# Patient Record
Sex: Female | Born: 1970 | Race: White | Hispanic: No | Marital: Married | Smoking: Never smoker
Health system: Southern US, Community
[De-identification: ages and names within clinical notes are randomized; demographics above are authoritative.]

## PROBLEM LIST (undated history)

## (undated) DIAGNOSIS — R011 Cardiac murmur, unspecified: Secondary | ICD-10-CM

## (undated) DIAGNOSIS — T7840XA Allergy, unspecified, initial encounter: Secondary | ICD-10-CM

## (undated) HISTORY — PX: BREAST ENHANCEMENT SURGERY: SHX7

## (undated) HISTORY — PX: COSMETIC SURGERY: SHX468

## (undated) HISTORY — DX: Allergy, unspecified, initial encounter: T78.40XA

## (undated) HISTORY — PX: TOOTH EXTRACTION: SUR596

---

## 2004-09-29 ENCOUNTER — Inpatient Hospital Stay (HOSPITAL_COMMUNITY): Admission: AD | Admit: 2004-09-29 | Discharge: 2004-10-01 | Payer: Self-pay | Admitting: *Deleted

## 2007-05-22 ENCOUNTER — Encounter: Admission: RE | Admit: 2007-05-22 | Discharge: 2007-05-22 | Payer: Self-pay | Admitting: *Deleted

## 2011-10-03 ENCOUNTER — Ambulatory Visit (INDEPENDENT_AMBULATORY_CARE_PROVIDER_SITE_OTHER): Payer: Managed Care, Other (non HMO)

## 2011-10-03 DIAGNOSIS — J029 Acute pharyngitis, unspecified: Secondary | ICD-10-CM

## 2011-10-03 DIAGNOSIS — B9789 Other viral agents as the cause of diseases classified elsewhere: Secondary | ICD-10-CM

## 2014-03-30 ENCOUNTER — Ambulatory Visit (INDEPENDENT_AMBULATORY_CARE_PROVIDER_SITE_OTHER): Payer: Managed Care, Other (non HMO) | Admitting: Family Medicine

## 2014-03-30 VITALS — BP 118/76 | HR 68 | Temp 98.3°F | Resp 16 | Ht 64.5 in | Wt 158.6 lb

## 2014-03-30 DIAGNOSIS — R5381 Other malaise: Secondary | ICD-10-CM

## 2014-03-30 DIAGNOSIS — R5383 Other fatigue: Secondary | ICD-10-CM

## 2014-03-30 DIAGNOSIS — R112 Nausea with vomiting, unspecified: Secondary | ICD-10-CM

## 2014-03-30 DIAGNOSIS — R51 Headache: Secondary | ICD-10-CM

## 2014-03-30 DIAGNOSIS — B349 Viral infection, unspecified: Secondary | ICD-10-CM

## 2014-03-30 DIAGNOSIS — B9789 Other viral agents as the cause of diseases classified elsewhere: Secondary | ICD-10-CM

## 2014-03-30 LAB — COMPREHENSIVE METABOLIC PANEL
ALBUMIN: 4.8 g/dL (ref 3.5–5.2)
ALT: 10 U/L (ref 0–35)
AST: 17 U/L (ref 0–37)
Alkaline Phosphatase: 45 U/L (ref 39–117)
BUN: 22 mg/dL (ref 6–23)
CHLORIDE: 106 meq/L (ref 96–112)
CO2: 25 meq/L (ref 19–32)
Calcium: 9.1 mg/dL (ref 8.4–10.5)
Creat: 0.83 mg/dL (ref 0.50–1.10)
GLUCOSE: 101 mg/dL — AB (ref 70–99)
POTASSIUM: 4.8 meq/L (ref 3.5–5.3)
SODIUM: 138 meq/L (ref 135–145)
TOTAL PROTEIN: 7.3 g/dL (ref 6.0–8.3)
Total Bilirubin: 0.5 mg/dL (ref 0.2–1.2)

## 2014-03-30 LAB — POCT UA - MICROSCOPIC ONLY
BACTERIA, U MICROSCOPIC: NEGATIVE
CRYSTALS, UR, HPF, POC: NEGATIVE
Casts, Ur, LPF, POC: NEGATIVE
Mucus, UA: NEGATIVE
WBC, UR, HPF, POC: NEGATIVE
Yeast, UA: NEGATIVE

## 2014-03-30 LAB — POCT URINALYSIS DIPSTICK
BILIRUBIN UA: NEGATIVE
GLUCOSE UA: NEGATIVE
KETONES UA: NEGATIVE
Leukocytes, UA: NEGATIVE
Nitrite, UA: NEGATIVE
Protein, UA: NEGATIVE
SPEC GRAV UA: 1.02
Urobilinogen, UA: 0.2
pH, UA: 6

## 2014-03-30 LAB — POCT CBC
GRANULOCYTE PERCENT: 51 % (ref 37–80)
HCT, POC: 38.9 % (ref 37.7–47.9)
Hemoglobin: 12.7 g/dL (ref 12.2–16.2)
Lymph, poc: 2.4 (ref 0.6–3.4)
MCH, POC: 29.5 pg (ref 27–31.2)
MCHC: 32.6 g/dL (ref 31.8–35.4)
MCV: 90.2 fL (ref 80–97)
MID (CBC): 0.4 (ref 0–0.9)
MPV: 7.3 fL (ref 0–99.8)
PLATELET COUNT, POC: 360 10*3/uL (ref 142–424)
POC Granulocyte: 2.9 (ref 2–6.9)
POC LYMPH PERCENT: 42.7 %L (ref 10–50)
POC MID %: 6.3 %M (ref 0–12)
RBC: 4.31 M/uL (ref 4.04–5.48)
RDW, POC: 13.3 %
WBC: 5.7 10*3/uL (ref 4.6–10.2)

## 2014-03-30 MED ORDER — ONDANSETRON 4 MG PO TBDP: ORAL_TABLET | ORAL | Status: DC

## 2014-03-30 NOTE — Patient Instructions (Signed)
Drink plenty of fluids  Rest in a cool environment the next couple of days  Call or return if worse

## 2014-03-30 NOTE — Progress Notes (Signed)
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 Has a lot of stress in her life. She has felt bad the last few days. She has had a headache much of the time. She feels very fatigued. She doesn't rest well. She has worked in the [employer] [place], with only a little fan in her truck. This morning she vomited. She didn't go to work, walked out to her vehicle to check things out, cut feelings worse and came to the office.  Objective: Healthy-appearing young lady in no acute distress. TMs normal. Eyes PERRLA. Throat clear. Neck supple without nodes. Chest clear. Heart regular without murmurs. And soft without mass or tenderness.  Assessment: Fatigue and generalized malaise, etiology unknown Headache Vomiting  Results for orders placed in visit on 03/30/14  POCT CBC      Result Value Ref Range   WBC 5.7  4.6 - 10.2 K/uL   Lymph, poc 2.4  0.6 - 3.4   POC LYMPH PERCENT 42.7  10 - 50 %L   MID (cbc) 0.4  0 - 0.9   POC MID % 6.3  0 - 12 %M   POC Granulocyte 2.9  2 - 6.9   Granulocyte percent 51.0  37 - 80 %G   RBC 4.31  4.04 - 5.48 M/uL   Hemoglobin 12.7  12.2 - 16.2 g/dL   HCT, POC 13.038.9  86.537.7 - 47.9 %   MCV 90.2  80 - 97 fL   MCH, POC 29.5  27 - 31.2 pg   MCHC 32.6  31.8 - 35.4 g/dL   RDW, POC 78.413.3     Platelet Count, POC 360  142 - 424 K/uL   MPV 7.3  0 - 99.8 fL  POCT UA - MICROSCOPIC ONLY      Result Value Ref Range   WBC, Ur, HPF, POC neg     RBC, urine, microscopic 5-7     Bacteria, U Microscopic neg     Mucus, UA neg     Epithelial cells, urine per micros 0-2     Crystals, Ur, HPF, POC neg     Casts, Ur, LPF, POC neg     Yeast, UA neg    POCT URINALYSIS DIPSTICK      Result Value Ref Range   Color, UA light yellow     Clarity, UA slightly cloudy     Glucose, UA neg     Bilirubin, UA neg     Ketones, UA neg     Spec Grav, UA 1.020     Blood, UA mod     pH, UA 6.0     Protein, UA neg     Urobilinogen, UA 0.2     Nitrite, UA neg     Leukocytes,  UA Negative     Prob viral illness.

## 2015-07-21 IMAGING — CR DG LUMBAR SPINE COMPLETE 4+V
5 series · 5 of 5 positions shown · non-contrast
Comparison: None.

CLINICAL DATA: Left hip pain after fall

LUMBAR SPINE - COMPLETE 4+ VIEW

[AP]
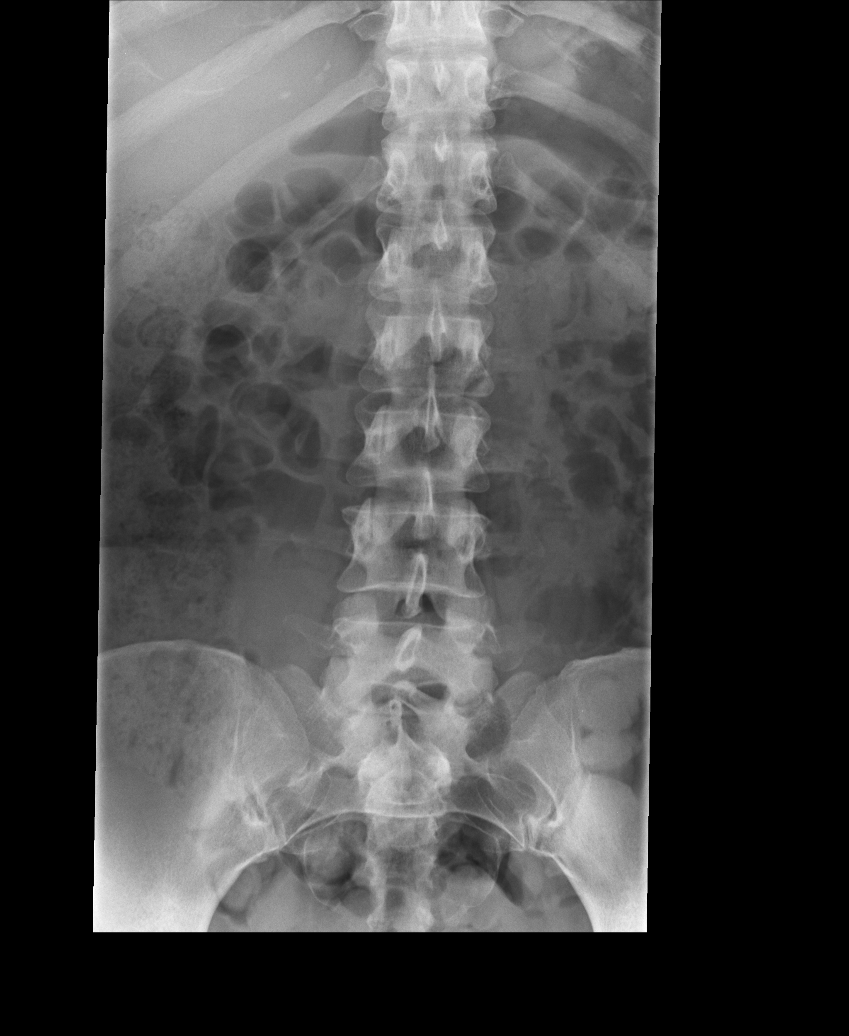

[rpo]
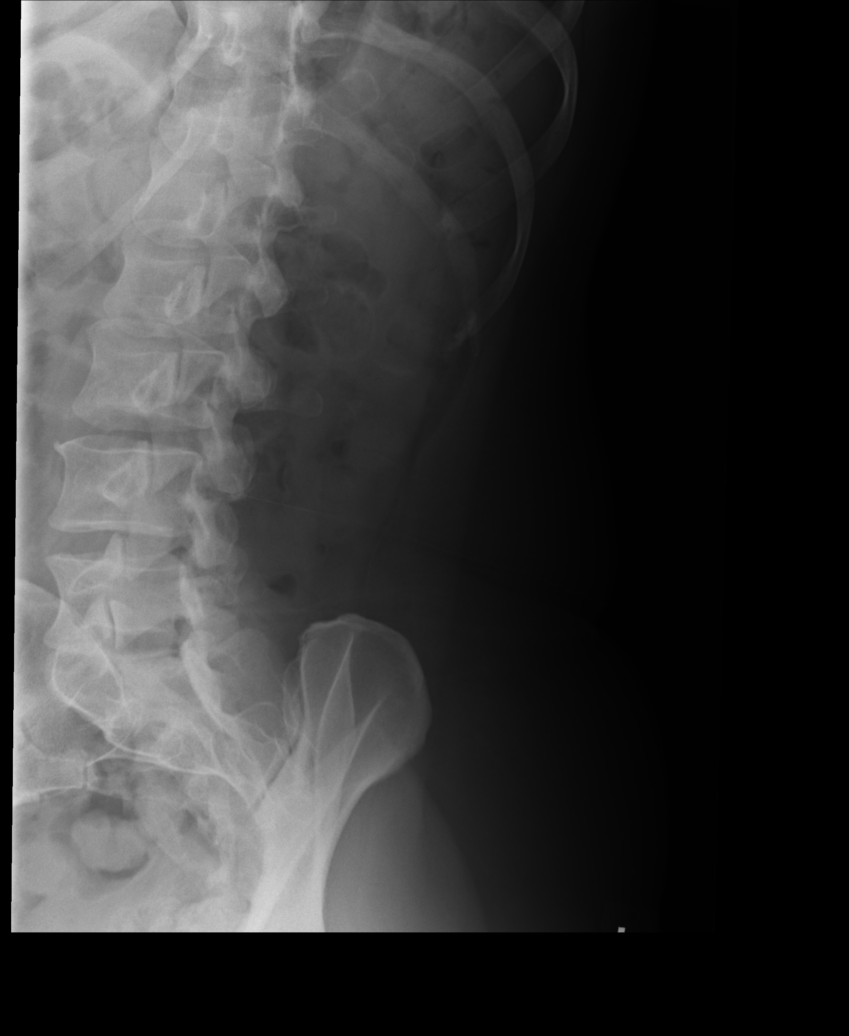

[lpo]
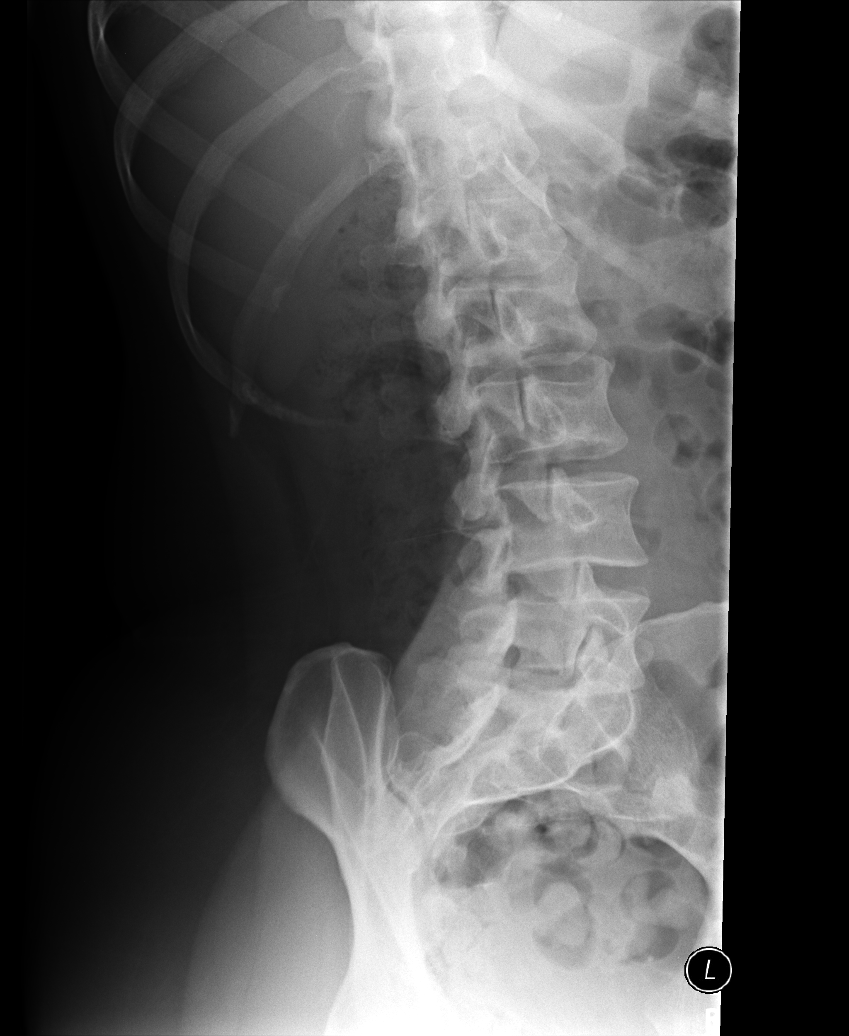

[lateral]
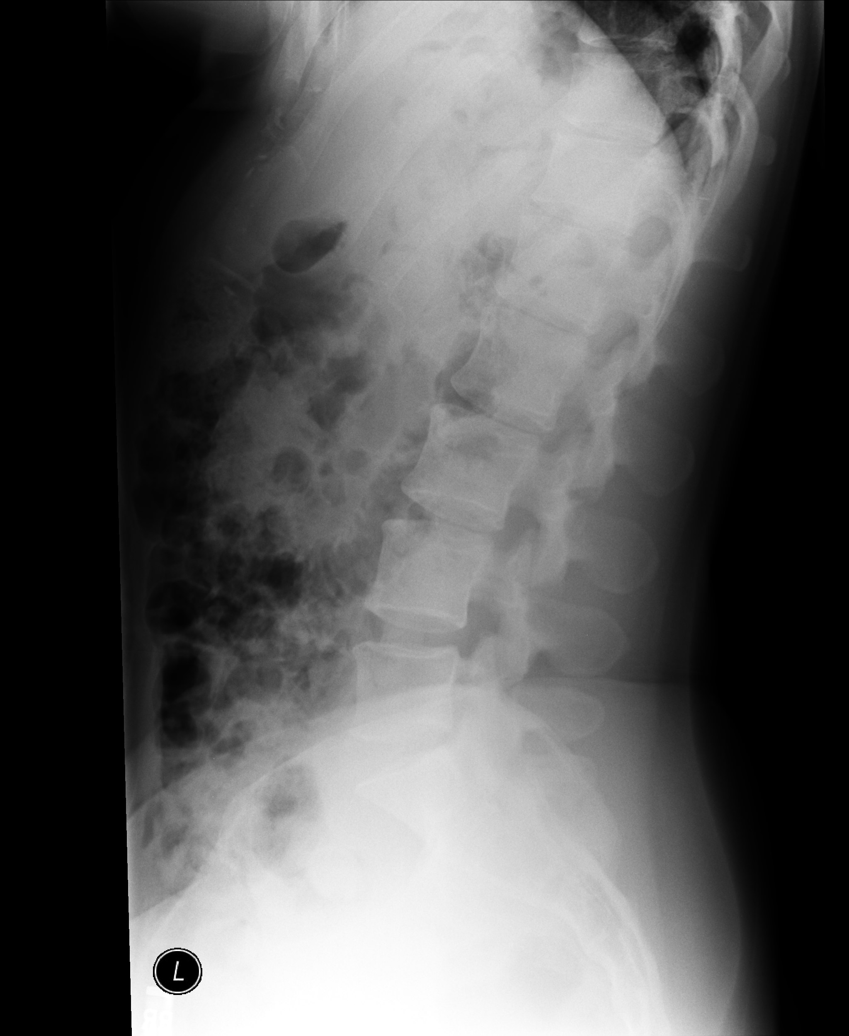

[l5 s1]
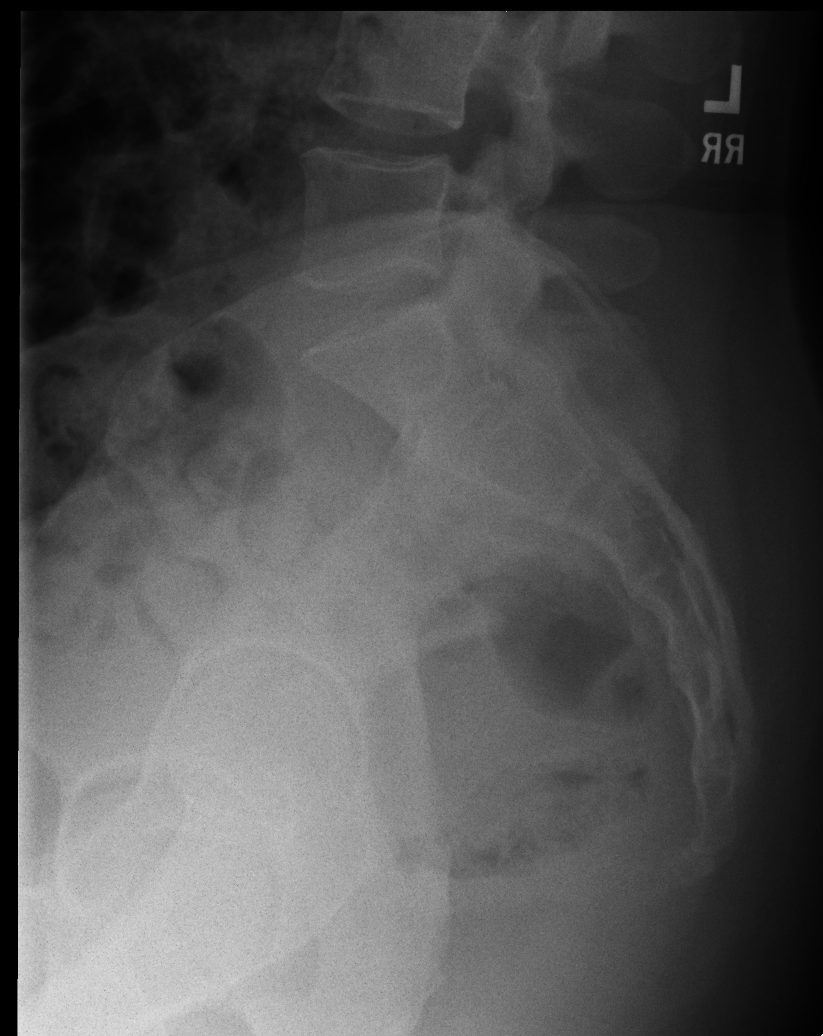

[5 of 5 positions shown; findings below may reference images not displayed]

FINDINGS: No acute fracture or spondylolisthesis is noted.
Bilateral pars defects are noted at L5. Disc spaces are well
maintained.
IMPRESSION: Bilateral pars defects at L5.  No acute abnormality seen in the
lumbar spine.

## 2022-07-22 ENCOUNTER — Ambulatory Visit (INDEPENDENT_AMBULATORY_CARE_PROVIDER_SITE_OTHER): Payer: 59

## 2022-07-22 ENCOUNTER — Ambulatory Visit
Admission: EM | Admit: 2022-07-22 | Discharge: 2022-07-22 | Disposition: A | Discharge status: Home / Self Care | Payer: 59 | Attending: Family Medicine | Admitting: Family Medicine

## 2022-07-22 DIAGNOSIS — R0789 Other chest pain: Secondary | ICD-10-CM | POA: Diagnosis not present

## 2022-07-22 DIAGNOSIS — R079 Chest pain, unspecified: Secondary | ICD-10-CM | POA: Diagnosis not present

## 2022-07-22 HISTORY — DX: Cardiac murmur, unspecified: R01.1

## 2022-07-22 NOTE — ED Triage Notes (Addendum)
Pt presents to Urgent Care with c/o chest pain and L arm pain since this AM. Denies sob. States she had some nausea this morning before eating, but she states is not uncommon for her. Reports syncopal episode approx 2 months ago w/ episode of diarrhea--no f/u exam.

## 2022-07-22 NOTE — Discharge Instructions (Addendum)
Advised patient EKG and CXR were within normal limits and unremarkable today.  Advised if symptoms worsen and/or unresolved please go to nearest ED for further evaluation or follow-up with your PCP.

## 2022-07-22 NOTE — ED Provider Notes (Signed)
[facility]    CSN: [id number] Arrival date & time: 07/22/22  0949      History   Chief Complaint Chief Complaint  Patient presents with   Chest Pain   Arm Pain    HPI Jocelyn Coleman is a 51 y.o. female.   HPI Pleasant 51 year old female presents to urgent care with chest pain and left arm pain since earlier this morning.  Patient reports minimal nausea as well.  Patient reports 1 syncopal episode 2 months ago.  PMH significant for obesity and heart murmur.  Past Medical History:  Diagnosis Date   Heart murmur     There are no problems to display for this patient.   Past Surgical History:  Procedure Laterality Date   TOOTH EXTRACTION      OB History   No obstetric history on file.      Home Medications    Prior to Admission medications   Medication Sig Start Date End Date Taking? Authorizing Provider  ondansetron (ZOFRAN ODT) 4 MG disintegrating tablet Dissolve one in mouth every 6 hours only if needed for nausea 03/30/14   Peyton Najjar, MD    Family History Family History  Problem Relation Age of Onset   Cancer Mother    Hypertension Mother    Hyperlipidemia Father    Hypertension Father     Social History Social History   Tobacco Use   Smoking status: Never   Smokeless tobacco: Never  Vaping Use   Vaping Use: Never used  Substance Use Topics   Alcohol use: Yes    Comment: rarely   Drug use: No     Allergies   Erythromycin   Review of Systems Review of Systems  Cardiovascular:  Positive for chest pain.  All other systems reviewed and are negative.    Physical Exam Triage Vital Signs ED Triage Vitals  Enc Vitals Group     BP 07/22/22 1002 121/82     Pulse Rate 07/22/22 1002 70     Resp 07/22/22 1002 20     Temp 07/22/22 1002 99.1 F (37.3 C)     Temp src --      SpO2 07/22/22 1002 97 %     Weight 07/22/22 1011 176 lb (79.8 kg)     Height 07/22/22 1011 5\' 3"  (1.6 m)     Head Circumference --      Peak Flow  --      Pain Score 07/22/22 1003 4     Pain Loc --      Pain Edu? --      Excl. in GC? --    No data found.  Updated Vital Signs BP 121/82 (BP Location: Right Arm)   Pulse 70   Temp 99.1 F (37.3 C)   Resp 20   Ht 5\' 3"  (1.6 m)   Wt 176 lb (79.8 kg)   LMP 07/19/2022 (Exact Date)   SpO2 97%   BMI 31.18 kg/m    Physical Exam Vitals and nursing note reviewed.  Constitutional:      General: She is not in acute distress.    Appearance: Normal appearance. She is obese. She is not ill-appearing.  HENT:     Head: Normocephalic and atraumatic.     Mouth/Throat:     Mouth: Mucous membranes are moist.     Pharynx: Oropharynx is clear.  Eyes:     Extraocular Movements: Extraocular movements intact.     Conjunctiva/sclera: Conjunctivae normal.  Pupils: Pupils are equal, round, and reactive to light.  Neck:     Comments: No JVD, no bruit Cardiovascular:     Rate and Rhythm: Normal rate and regular rhythm.     Pulses: Normal pulses.     Heart sounds: Normal heart sounds.  Pulmonary:     Effort: Pulmonary effort is normal.     Breath sounds: Normal breath sounds. No wheezing, rhonchi or rales.  Musculoskeletal:        General: Normal range of motion.     Cervical back: Normal range of motion and neck supple. No tenderness.  Lymphadenopathy:     Cervical: No cervical adenopathy.  Skin:    General: Skin is warm and dry.  Neurological:     General: No focal deficit present.     Mental Status: She is alert and oriented to person, place, and time.      UC Treatments / Results  Labs (all labs ordered are listed, but only abnormal results are displayed) Labs Reviewed - No data to display  EKG   Radiology DG Chest 2 View  Result Date: 07/22/2022 CLINICAL DATA:  Chest pain. EXAM: CHEST - 2 VIEW COMPARISON:  None Available. FINDINGS: Low lung volumes. No consolidation. No visible pleural effusions or pneumothorax. Cardiomediastinal silhouette is within normal limits  for technique. No evidence of acute osseous abnormality. IMPRESSION: Low lung volumes without evidence of acute cardiopulmonary disease. Electronically Signed   By: Margaretha Sheffield M.D.   On: 07/22/2022 10:42    Procedures Procedures (including critical care time)  Medications Ordered in UC Medications - No data to display  Initial Impression / Assessment and Plan / UC Course  I have reviewed the triage vital signs and the nursing notes.  Pertinent labs & imaging results that were available during my care of the patient were reviewed by me and considered in my medical decision making (see chart for details).     MDM: 1.  Chest pain unspecified-EKG revealed normal sinus rhythm, CXR revealed above. Advised patient EKG and CXR were within normal limits and unremarkable today.  Advised if symptoms worsen and/or unresolved please go to nearest ED for further evaluation or follow-up with your PCP.  Final Clinical Impressions(s) / UC Diagnoses   Final diagnoses:  Chest pain, unspecified type     Discharge Instructions      Advised patient EKG and CXR were within normal limits and unremarkable today.  Advised if symptoms worsen and/or unresolved please go to nearest ED for further evaluation or follow-up with your PCP.     ED Prescriptions   None    PDMP not reviewed this encounter.   Eliezer Lofts, [place] 07/22/22 1058

## 2022-07-23 ENCOUNTER — Telehealth: Payer: Self-pay

## 2022-07-23 NOTE — Telephone Encounter (Signed)
TCT pt to follow up from recent visit. VM not set up.

## 2023-11-15 ENCOUNTER — Other Ambulatory Visit: Payer: Self-pay

## 2023-11-15 ENCOUNTER — Ambulatory Visit
Admission: EM | Admit: 2023-11-15 | Discharge: 2023-11-15 | Disposition: A | Discharge status: Home / Self Care | Payer: 59 | Attending: Family Medicine | Admitting: Family Medicine

## 2023-11-15 DIAGNOSIS — R059 Cough, unspecified: Secondary | ICD-10-CM

## 2023-11-15 DIAGNOSIS — R509 Fever, unspecified: Secondary | ICD-10-CM

## 2023-11-15 DIAGNOSIS — R6889 Other general symptoms and signs: Secondary | ICD-10-CM

## 2023-11-15 DIAGNOSIS — R110 Nausea: Secondary | ICD-10-CM

## 2023-11-15 LAB — POC SARS CORONAVIRUS 2 AG -  ED: SARS Coronavirus 2 Ag: NEGATIVE

## 2023-11-15 MED ORDER — ONDANSETRON 8 MG PO TBDP
8.0000 mg | ORAL_TABLET | Freq: Once | ORAL | Status: AC
  Administered 2023-11-15: 8 mg via ORAL

## 2023-11-15 MED ORDER — ONDANSETRON 8 MG PO TBDP: 8.0000 mg | ORAL_TABLET | Freq: Three times a day (TID) | ORAL | 0 refills | Status: DC | PRN

## 2023-11-15 MED ORDER — OSELTAMIVIR PHOSPHATE 75 MG PO CAPS: 75.0000 mg | ORAL_CAPSULE | Freq: Two times a day (BID) | ORAL | 0 refills | Status: DC

## 2023-11-15 MED ORDER — PROMETHAZINE-DM 6.25-15 MG/5ML PO SYRP: 5.0000 mL | ORAL_SOLUTION | Freq: Two times a day (BID) | ORAL | 0 refills | Status: DC | PRN

## 2023-11-15 MED ORDER — BENZONATATE 200 MG PO CAPS: 200.0000 mg | ORAL_CAPSULE | Freq: Three times a day (TID) | ORAL | 0 refills | Status: AC | PRN

## 2023-11-15 NOTE — Discharge Instructions (Addendum)
 Advised patient to take medication as directed with food to completion.  Advised may take Zofran  daily, as needed for nausea.  Advised may take Tessalon  capsules daily or as needed for cough.  Advised may take Promethazine  DM at night for cough prior to sleep due to sedative effects.  Advised patient may take OTC Tylenol 1 g every 6 hours for fever (oral temperature greater than 100.3).  Encouraged to increase daily water intake to 64 ounces per day while taking these medications.  Advised if symptoms worsen and/or unresolved please follow-up with PCP or here for further evaluation.

## 2023-11-15 NOTE — ED Provider Notes (Signed)
 [facility]    CSN: [id number] Arrival date & time: 11/15/23  0815      History   Chief Complaint Chief Complaint  Patient presents with   Fever   Nasal Congestion   Sore Throat    HPI Jocelyn Coleman is a 53 y.o. female.   HPI 53 year old female presents with runny nose, fever and sore throat since yesterday.  Fever of 101 this morning.  Patient is accompanied by her husband this morning.  Past Medical History:  Diagnosis Date   Heart murmur     There are no active problems to display for this patient.   Past Surgical History:  Procedure Laterality Date   TOOTH EXTRACTION      OB History   No obstetric history on file.      Home Medications    Prior to Admission medications   Medication Sig Start Date End Date Taking? Authorizing Provider  benzonatate  (TESSALON ) 200 MG capsule Take 1 capsule (200 mg total) by mouth 3 (three) times daily as needed for up to 7 days. 11/15/23 11/22/23 Yes Teddy Sharper, FNP  ondansetron  (ZOFRAN -ODT) 8 MG disintegrating tablet Take 1 tablet (8 mg total) by mouth every 8 (eight) hours as needed for nausea or vomiting. 11/15/23  Yes Teddy Sharper, FNP  oseltamivir  (TAMIFLU ) 75 MG capsule Take 1 capsule (75 mg total) by mouth every 12 (twelve) hours. 11/15/23  Yes Teddy Sharper, FNP  promethazine -dextromethorphan (PROMETHAZINE -DM) 6.25-15 MG/5ML syrup Take 5 mLs by mouth 2 (two) times daily as needed for cough. 11/15/23  Yes Teddy Sharper, FNP    Family History Family History  Problem Relation Age of Onset   Cancer Mother    Hypertension Mother    Hyperlipidemia Father    Hypertension Father     Social History Social History   Tobacco Use   Smoking status: Never   Smokeless tobacco: Never  Vaping Use   Vaping status: Never Used  Substance Use Topics   Alcohol use: Yes    Comment: rarely   Drug use: No     Allergies   Erythromycin   Review of Systems Review of Systems  Constitutional:  Positive for  fever.  HENT:  Positive for congestion, rhinorrhea and sore throat.   Gastrointestinal:  Positive for nausea.  All other systems reviewed and are negative.    Physical Exam Triage Vital Signs ED Triage Vitals  Encounter Vitals Group     BP 11/15/23 0831 101/70     Systolic BP Percentile --      Diastolic BP Percentile --      Pulse Rate 11/15/23 0831 97     Resp 11/15/23 0831 17     Temp 11/15/23 0831 98.6 F (37 C)     Temp Source 11/15/23 0831 Oral     SpO2 11/15/23 0831 97 %     Weight --      Height --      Head Circumference --      Peak Flow --      Pain Score 11/15/23 0832 8     Pain Loc --      Pain Education --      Exclude from Growth Chart --    No data found.  Updated Vital Signs BP 101/70 (BP Location: Left Arm)   Pulse 97   Temp 98.6 F (37 C) (Oral)   Resp 17   LMP  (LMP Unknown)   SpO2 97%  Physical Exam Vitals and nursing note reviewed.  Constitutional:      Appearance: Normal appearance. She is well-developed. She is obese. She is ill-appearing.  HENT:     Head: Normocephalic and atraumatic.     Right Ear: Tympanic membrane, ear canal and external ear normal.     Left Ear: Tympanic membrane, ear canal and external ear normal.     Mouth/Throat:     Mouth: Mucous membranes are moist.     Pharynx: Oropharynx is clear. Uvula midline.  Eyes:     Extraocular Movements: Extraocular movements intact.     Conjunctiva/sclera: Conjunctivae normal.     Pupils: Pupils are equal, round, and reactive to light.  Cardiovascular:     Rate and Rhythm: Normal rate and regular rhythm.     Pulses: Normal pulses.     Heart sounds: Normal heart sounds. No murmur heard.    No friction rub. No gallop.  Pulmonary:     Effort: Pulmonary effort is normal.     Breath sounds: Normal breath sounds. No wheezing, rhonchi or rales.     Comments: Infrequent nonproductive cough on exam Chest:     Chest wall: No tenderness.  Musculoskeletal:        General: Normal  range of motion.     Cervical back: Normal range of motion and neck supple.  Skin:    General: Skin is warm and dry.  Neurological:     General: No focal deficit present.     Mental Status: She is alert and oriented to person, place, and time.  Psychiatric:        Mood and Affect: Mood normal.        Behavior: Behavior normal.      UC Treatments / Results  Labs (all labs ordered are listed, but only abnormal results are displayed) Labs Reviewed  POC SARS CORONAVIRUS 2 AG -  ED    EKG   Radiology No results found.  Procedures Procedures (including critical care time)  Medications Ordered in UC Medications  ondansetron  (ZOFRAN -ODT) disintegrating tablet 8 mg (8 mg Oral Given 11/15/23 0912)    Initial Impression / Assessment and Plan / UC Course  I have reviewed the triage vital signs and the nursing notes.  Pertinent labs & imaging results that were available during my care of the patient were reviewed by me and considered in my medical decision making (see chart for details).     MDM: 1.  Influenza-like symptoms-Rx'd Tamiflu  75 mg capsule: Take 1 capsule twice daily x 5 days; 2.  Nausea-Zofran  8 mg disintegrating tablet given once in clinic and prior to discharge, Rx'd Zofran  8 mg disintegrating tablet: Take 1 tablet every 8 hours, as needed for nausea or vomiting; 3.  Fever-Advised patient may take OTC Tylenol 1 g every 6 hours for fever (oral temperature greater than 100.3). 4.  Cough, unspecified type-Rx'd Tessalon  200 mg capsule: Take 1 capsule 3 times daily, as needed for cough, Rx'd Promethazine  DM 6.25-50 Mg/5 mL syrup: Take 5 mL twice daily, as needed for cough. Advised patient to take medication as directed with food to completion.  Advised may take Zofran  daily, as needed for nausea.  Advised may take Tessalon  capsules daily or as needed for cough.  Advised may take Promethazine  DM at night for cough prior to sleep due to sedative effects.  Advised patient may take  OTC Tylenol 1 g every 6 hours for fever (oral temperature greater than 100.3).  Encouraged to increase  daily water intake to 64 ounces per day while taking these medications.  Advised if symptoms worsen and/or unresolved please follow-up with PCP or here for further evaluation.  Patient discharged home, hemodynamically stable.  Work note provided to patient prior to discharge today. Final Clinical Impressions(s) / UC Diagnoses   Final diagnoses:  Nausea  Influenza-like symptoms  Cough, unspecified type  Fever, unspecified     Discharge Instructions      Advised patient to take medication as directed with food to completion.  Advised may take Zofran  daily, as needed for nausea.  Advised may take Tessalon  capsules daily or as needed for cough.  Advised may take Promethazine  DM at night for cough prior to sleep due to sedative effects.  Advised patient may take OTC Tylenol 1 g every 6 hours for fever (oral temperature greater than 100.3).  Encouraged to increase daily water intake to 64 ounces per day while taking these medications.  Advised if symptoms worsen and/or unresolved please follow-up with PCP or here for further evaluation.     ED Prescriptions     Medication Sig Dispense Auth. Provider   oseltamivir  (TAMIFLU ) 75 MG capsule Take 1 capsule (75 mg total) by mouth every 12 (twelve) hours. 10 capsule Nigel Ericsson, FNP   ondansetron  (ZOFRAN -ODT) 8 MG disintegrating tablet Take 1 tablet (8 mg total) by mouth every 8 (eight) hours as needed for nausea or vomiting. 24 tablet Avary Pitsenbarger, FNP   benzonatate  (TESSALON ) 200 MG capsule Take 1 capsule (200 mg total) by mouth 3 (three) times daily as needed for up to 7 days. 40 capsule Derec Mozingo, FNP   promethazine -dextromethorphan (PROMETHAZINE -DM) 6.25-15 MG/5ML syrup Take 5 mLs by mouth 2 (two) times daily as needed for cough. 118 mL Reo Portela, FNP      PDMP not reviewed this encounter.   Teddy Sharper, FNP 11/15/23  [phone]

## 2023-11-15 NOTE — ED Triage Notes (Signed)
 Pt c/o congestion/runny nose, fever and sore throat since yesterday. Fever of 101 this am. Tylenol prn.

## 2024-03-08 ENCOUNTER — Encounter: Payer: Self-pay | Admitting: Obstetrics & Gynecology

## 2024-03-08 ENCOUNTER — Ambulatory Visit: Admitting: Obstetrics & Gynecology

## 2024-03-08 ENCOUNTER — Other Ambulatory Visit (HOSPITAL_COMMUNITY)
Admission: RE | Admit: 2024-03-08 | Discharge: 2024-03-08 | Disposition: A | Discharge status: Home / Self Care | Source: Ambulatory Visit | Attending: Obstetrics & Gynecology | Admitting: Obstetrics & Gynecology

## 2024-03-08 VITALS — BP 120/82 | HR 79 | Ht 63.0 in | Wt 188.0 lb

## 2024-03-08 DIAGNOSIS — Z01419 Encounter for gynecological examination (general) (routine) without abnormal findings: Secondary | ICD-10-CM | POA: Insufficient documentation

## 2024-03-08 DIAGNOSIS — Z1331 Encounter for screening for depression: Secondary | ICD-10-CM | POA: Diagnosis not present

## 2024-03-08 NOTE — Progress Notes (Signed)
 Last pap smear (date and result):last 17 years ago Last mammogram (date and result):last 10 years ago Last colon screening (date and result):never Brush:yes Floss:yes Seatbelts: yes Sunscreen: yes   Subjective:     Jocelyn Coleman is a 53 y.o. female here for a routine exam.  Current complaints: none--establishing care.     Gynecologic History Patient's last menstrual period was 02/16/2024. Contraception: coitus interuptus Last pap smear (date and result):last 17 years ago Last mammogram (date and result):last 10 years ago Last colon screening (date and result):never Brush:yes Floss:yes Seatbelts: yes Sunscreen: yes   Obstetric History OB History  Gravida Para Term Preterm AB Living  3    1 2   SAB IAB Ectopic Multiple Live Births  1        # Outcome Date GA Lbr Len/2nd Weight Sex Type Anes PTL Lv  3 Gravida           2 Gravida           1 SAB              The following portions of the patient's history were reviewed and updated as appropriate: allergies, current medications, past family history, past medical history, past social history, past surgical history, and problem list.  Review of Systems Pertinent items noted in HPI and remainder of comprehensive ROS otherwise negative.    Objective:     Vitals:   03/08/24 1405  BP: 120/82  Pulse: 79  Weight: 188 lb (85.3 kg)  Height: 5\' 3"  (1.6 m)   Vitals:  WNL General appearance: alert, cooperative and no distress  HEENT: Normocephalic, without obvious abnormality, atraumatic Eyes: negative Throat: lips, dry mucosa and tongue normal; teeth and gums normal  Respiratory: Clear to auscultation bilaterally  CV: Regular rate and rhythm  Breasts:  no masses or tenderness, no nipple retraction or dimpling, implants noted with left breast implant deflated--felt when it deflated and going back to Dr. Jonna Netter  GI: Soft, non-tender; bowel sounds normal; no masses,  no organomegaly  GU: External Genitalia:  Tanner V, no  lesion Urethra:  No prolapse   Vagina: Pink, normal rugae, no blood or discharge, stool in rectal vault felt on bimanual.  Cervix: No CMT, no lesion  Uterus:  Normal size and contour, non tender  Adnexa: Normal, no masses, non tender  Musculoskeletal: No edema, redness or tenderness in the calves or thighs  Skin: No lesions or rash  Lymphatic: Axillary adenopathy: none     Psychiatric: Normal mood and behavior        Assessment:    Healthy female exam.    Plan:   Pap with cotesting Keep menstrual diary--reviewed when to come see us  with abnml perimenoapusal bleeding Advised could still become pregnant although not likely.  Declines other contraception Constipation--increase water intake as this works for her.   Maternal hx of ?2 breast cancer primaries--pt to ask about 2 primaries and if she had genetic testing.  Referral for colonoscopy Routine health maintenance labs Referral to [facility] for PCP Yearly mammograms

## 2024-03-09 ENCOUNTER — Other Ambulatory Visit: Payer: Self-pay | Admitting: Medical Genetics

## 2024-03-10 LAB — CYTOLOGY - PAP
Comment: NEGATIVE
Diagnosis: NEGATIVE
High risk HPV: NEGATIVE

## 2024-03-15 ENCOUNTER — Ambulatory Visit: Payer: Self-pay | Admitting: Obstetrics & Gynecology

## 2024-03-16 ENCOUNTER — Encounter: Payer: Self-pay | Admitting: Family Medicine

## 2024-03-16 ENCOUNTER — Ambulatory Visit: Admitting: Family Medicine

## 2024-03-16 VITALS — BP 112/76 | HR 69 | Temp 98.1°F | Resp 18 | Ht 63.0 in | Wt 189.4 lb

## 2024-03-16 DIAGNOSIS — L659 Nonscarring hair loss, unspecified: Secondary | ICD-10-CM | POA: Diagnosis not present

## 2024-03-16 DIAGNOSIS — Z7689 Persons encountering health services in other specified circumstances: Secondary | ICD-10-CM | POA: Diagnosis not present

## 2024-03-16 DIAGNOSIS — Z13228 Encounter for screening for other metabolic disorders: Secondary | ICD-10-CM | POA: Insufficient documentation

## 2024-03-16 NOTE — Assessment & Plan Note (Signed)
 Has noticed increased hair loss in shower in the past 3 weeks. No bald spots noted today in office. Has thyroid function test ordered per GYN. She will share with this provider once these results are back. Avoid taking vitamins until after labs drawn. Follow-up based on results of labs.

## 2024-03-16 NOTE — Progress Notes (Signed)
 New Patient Office Visit  Subjective    Patient ID: Jocelyn Coleman, female    DOB: 07/05/1971  Age: 53 y.o. MRN: [id number]  CC:  Chief Complaint  Patient presents with   New Patient (Initial Visit)    Est. Care    HPI Jocelyn Coleman presents to establish care with this practice. She is new to me. Saw GYN recently who ordered labs. She is having hair loss.  Noticed hair loss 3 weeks ago in the shower. GYN has ordered thyroid labs to be done later this week. Has not taken vitamins.  No chronic conditions. Has not seen primary care in 10 years or more.   Weight loss: Cannot lose weight. Monitors macros. Cooks at home. Just starting walking. 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  Not interested in weight loss meds at this point.  Peri-menopause.  Continue to monitor.   Chart review: GYN well woman visit  03/08/24: pap smear normal. Referral placed for colonoscopy.         Outpatient Encounter Medications as of 03/16/2024  Medication Sig   ondansetron  (ZOFRAN -ODT) 8 MG disintegrating tablet Take 8 mg by mouth every 8 (eight) hours as needed.   No facility-administered encounter medications on file as of 03/16/2024.    Past Medical History:  Diagnosis Date   Allergy 1977   Swelling to throat   Heart murmur     Past Surgical History:  Procedure Laterality Date   BREAST ENHANCEMENT SURGERY     COSMETIC SURGERY  2025   Implants   TOOTH EXTRACTION      Family History  Problem Relation Age of Onset   Breast cancer Mother 67       ? had two primaries?   Hypertension Mother    Cancer Mother    Hyperlipidemia Father    Hypertension Father     Social History   Socioeconomic History   Marital status: Married    Spouse name: Not on file   Number of children: Not on file   Years of education: Not on file   Highest education level: Bachelor's degree (e.g., BA, AB, BS)  Occupational History   Not on file  Tobacco Use   Smoking  status: Never   Smokeless tobacco: Never  Vaping Use   Vaping status: Never Used  Substance and Sexual Activity   Alcohol use: Yes    Comment: rarely   Drug use: Never   Sexual activity: Yes    Birth control/protection: Coitus interruptus  Other Topics Concern   Not on file  Social History Narrative   Not on file   Social Drivers of Health   Financial Resource Strain: Medium Risk (03/15/2024)   Overall Financial Resource Strain (CARDIA)    Difficulty of Paying Living Expenses: Somewhat hard  Food Insecurity: No Food Insecurity (03/15/2024)   Hunger Vital Sign    Worried About Running Out of Food in the Last Year: Never true    Ran Out of Food in the Last Year: Never true  Transportation Needs: No Transportation Needs (03/15/2024)   PRAPARE - Administrator, Civil Service (Medical): No    Lack of Transportation (Non-Medical): No  Physical Activity: Insufficiently Active (03/15/2024)   Exercise Vital Sign    Days of Exercise per Week: 1 day    Minutes of Exercise per Session: 30 min  Stress: No Stress Concern Present (03/15/2024)   [employer] of Occupational Health -  Occupational Stress Questionnaire    Feeling of Stress : Only a little  Social Connections: Unknown (03/15/2024)   Social Connection and Isolation Panel [NHANES]    Frequency of Communication with Friends and Family: More than three times a week    Frequency of Social Gatherings with Friends and Family: Once a week    Attends Religious Services: Patient declined    Database administrator or Organizations: No    Attends Engineer, structural: Not on file    Marital Status: Married  Catering manager Violence: Not At Risk (01/12/2024)   Received from [facility]   HITS    Over the last 12 months how often did your partner physically hurt you?: Never    Over the last 12 months how often did your partner insult you or talk down to you?: Never    Over the last 12 months how often did your partner  threaten you with physical harm?: Never    Over the last 12 months how often did your partner scream or curse at you?: Never    ROS      Objective    BP 112/76 (BP Location: Left Arm, Patient Position: Sitting, Cuff Size: Normal)   Pulse 69   Temp 98.1 F (36.7 C)   Resp 18   Ht 5\' 3"  (1.6 m)   Wt 189 lb 6 oz (85.9 kg)   LMP 02/16/2024   SpO2 100%   BMI 33.55 kg/m   Physical Exam Vitals and nursing note reviewed.  Constitutional:      Appearance: Normal appearance. She is obese.  HENT:     Head:     Comments: No bald spots noted today.  Cardiovascular:     Rate and Rhythm: Normal rate and regular rhythm.     Heart sounds: Normal heart sounds.  Pulmonary:     Effort: Pulmonary effort is normal.     Breath sounds: Normal breath sounds.  Skin:    General: Skin is warm and dry.  Neurological:     General: No focal deficit present.     Mental Status: She is alert. Mental status is at baseline.  Psychiatric:        Mood and Affect: Mood normal.        Behavior: Behavior normal.        Thought Content: Thought content normal.        Judgment: Judgment normal.       Assessment & Plan:   Problem List Items Addressed This Visit     Establishing care with new doctor, encounter for - Primary   Hair loss   Has noticed increased hair loss in shower in the past 3 weeks. No bald spots noted today in office. Has thyroid function test ordered per GYN. She will share with this provider once these results are back. Avoid taking vitamins until after labs drawn. Follow-up based on results of labs.        Agrees with plan of care discussed.  Questions answered.   Return in about 1 year (around 03/09/2025) for CPE with labs.   Mickiel Albany, FNP

## 2024-03-20 LAB — CBC
Hematocrit: 40.1 % (ref 34.0–46.6)
Hemoglobin: 13 g/dL (ref 11.1–15.9)
MCH: 30.4 pg (ref 26.6–33.0)
MCHC: 32.4 g/dL (ref 31.5–35.7)
MCV: 94 fL (ref 79–97)
Platelets: 336 10*3/uL (ref 150–450)
RBC: 4.28 x10E6/uL (ref 3.77–5.28)
RDW: 12.9 % (ref 11.7–15.4)
WBC: 5.6 10*3/uL (ref 3.4–10.8)

## 2024-03-20 LAB — COMPREHENSIVE METABOLIC PANEL WITH GFR
ALT: 11 IU/L (ref 0–32)
AST: 17 IU/L (ref 0–40)
Albumin: 4.3 g/dL (ref 3.9–4.9)
Alkaline Phosphatase: 78 IU/L (ref 44–121)
BUN/Creatinine Ratio: 22 (ref 9–23)
BUN: 23 mg/dL (ref 6–24)
Bilirubin Total: 0.3 mg/dL (ref 0.0–1.2)
CO2: 20 mmol/L (ref 20–29)
Calcium: 9.5 mg/dL (ref 8.7–10.2)
Chloride: 102 mmol/L (ref 96–106)
Creatinine, Ser: 1.03 mg/dL — ABNORMAL HIGH (ref 0.57–1.00)
Globulin, Total: 2.7 g/dL (ref 1.5–4.5)
Glucose: 96 mg/dL (ref 70–99)
Potassium: 5 mmol/L (ref 3.5–5.2)
Sodium: 139 mmol/L (ref 134–144)
Total Protein: 7 g/dL (ref 6.0–8.5)
eGFR: 66 mL/min/{1.73_m2} (ref >59)

## 2024-03-21 ENCOUNTER — Encounter: Payer: Self-pay | Admitting: Family Medicine

## 2024-03-22 ENCOUNTER — Telehealth: Payer: Self-pay

## 2024-03-22 ENCOUNTER — Other Ambulatory Visit: Payer: Self-pay

## 2024-03-22 DIAGNOSIS — Z803 Family history of malignant neoplasm of breast: Secondary | ICD-10-CM

## 2024-03-22 NOTE — Telephone Encounter (Signed)
 Dr. Shira Dopp requested telephone call to follow up on family history of Breast  Cancer with verbal order for patient to complete testing through [facility]. Spoke with patient and confirmed family history, mother-Breast Cancer and paternal grandmother Kidney Cancer, Grandfather-Lymphedema.   Evelia Hipp, RN

## 2024-03-24 ENCOUNTER — Ambulatory Visit: Admitting: Family Medicine

## 2024-03-24 VITALS — BP 106/67 | HR 71 | Temp 98.6°F | Ht 63.0 in | Wt 186.1 lb

## 2024-03-24 DIAGNOSIS — R7989 Other specified abnormal findings of blood chemistry: Secondary | ICD-10-CM | POA: Insufficient documentation

## 2024-03-24 DIAGNOSIS — Z13228 Encounter for screening for other metabolic disorders: Secondary | ICD-10-CM | POA: Diagnosis not present

## 2024-03-24 NOTE — Assessment & Plan Note (Signed)
 Family history of pre-diabetes. A1C today. Lifestyle changes and exercise discussed for optimal health. Follow-up based on results.

## 2024-03-24 NOTE — Assessment & Plan Note (Signed)
 Elevated TSH per GYN TSH, free T4, and T3 today. Discussed follow-up if medication is needed. Information provided on hypothyroidism.

## 2024-03-24 NOTE — Progress Notes (Signed)
   Established Patient Office Visit  Subjective   Patient ID: Jocelyn Coleman, female    DOB: 12-Aug-1971  Age: 54 y.o. MRN: [id number]  Chief Complaint  Patient presents with   Follow-up    HPI Follow-up from elevated TSH per GYN. 03/18/24: TSH 6.990 Will get Free T4, T3 today. Not taking supplements with biotin. Feels sluggish and endorses constipation.  LDL 145  Family history of pre-diabetes. Check A1C today.    ROS    Objective:     BP 106/67 (BP Location: Left Arm, Patient Position: Sitting, Cuff Size: Normal)   Pulse 71   Temp 98.6 F (37 C) (Oral)   Ht 5' 3 (1.6 m)   Wt 186 lb 1.6 oz (84.4 kg)   LMP 02/16/2024   SpO2 99%   BMI 32.97 kg/m    Physical Exam Vitals and nursing note reviewed.  Constitutional:      General: She is not in acute distress.    Appearance: Normal appearance.   Cardiovascular:     Rate and Rhythm: Normal rate and regular rhythm.     Heart sounds: Normal heart sounds.  Pulmonary:     Effort: Pulmonary effort is normal.     Breath sounds: Normal breath sounds.   Skin:    General: Skin is warm and dry.   Neurological:     General: No focal deficit present.     Mental Status: She is alert. Mental status is at baseline.   Psychiatric:        Mood and Affect: Mood normal.        Behavior: Behavior normal.        Thought Content: Thought content normal.        Judgment: Judgment normal.     No results found for any visits on 03/24/24.    The 10-year ASCVD risk score (Arnett DK, et al., 2019) is: 0.9%    Assessment & Plan:   Problem List Items Addressed This Visit     Encounter for screening for metabolic disorder   Family history of pre-diabetes. A1C today. Lifestyle changes and exercise discussed for optimal health. Follow-up based on results.       Relevant Orders   Hemoglobin A1c   Elevated TSH - Primary   Elevated TSH per GYN TSH, free T4, and T3 today. Discussed follow-up if medication is  needed. Information provided on hypothyroidism.       Relevant Orders   TSH+T4F+T3Free  Agrees with plan of care discussed.  Questions answered.   Return for based on test results .    Mickiel Albany, FNP

## 2024-03-25 ENCOUNTER — Ambulatory Visit

## 2024-03-25 ENCOUNTER — Ambulatory Visit: Payer: Self-pay | Admitting: Family Medicine

## 2024-03-25 ENCOUNTER — Other Ambulatory Visit: Payer: Self-pay | Admitting: Family Medicine

## 2024-03-25 DIAGNOSIS — E038 Other specified hypothyroidism: Secondary | ICD-10-CM | POA: Insufficient documentation

## 2024-03-25 LAB — TSH+T4F+T3FREE
Free T4: 0.89 ng/dL (ref 0.82–1.77)
T3, Free: 2.5 pg/mL (ref 2.0–4.4)
TSH: 5.64 u[IU]/mL — ABNORMAL HIGH (ref 0.450–4.500)

## 2024-03-25 LAB — HEMOGLOBIN A1C
Est. average glucose Bld gHb Est-mCnc: 114 mg/dL
Hgb A1c MFr Bld: 5.6 % (ref 4.8–5.6)

## 2024-03-25 MED ORDER — LEVOTHYROXINE SODIUM 50 MCG PO TABS: 50.0000 ug | ORAL_TABLET | Freq: Every day | ORAL | 0 refills | Status: DC

## 2024-03-29 ENCOUNTER — Encounter: Payer: Self-pay | Admitting: Internal Medicine

## 2024-04-07 ENCOUNTER — Ambulatory Visit

## 2024-04-07 ENCOUNTER — Ambulatory Visit (HOSPITAL_BASED_OUTPATIENT_CLINIC_OR_DEPARTMENT_OTHER)
Admission: RE | Admit: 2024-04-07 | Discharge: 2024-04-07 | Disposition: A | Discharge status: Home / Self Care | Source: Ambulatory Visit | Attending: Family Medicine | Admitting: Family Medicine

## 2024-04-07 ENCOUNTER — Encounter (HOSPITAL_BASED_OUTPATIENT_CLINIC_OR_DEPARTMENT_OTHER): Payer: Self-pay

## 2024-04-07 DIAGNOSIS — Z1231 Encounter for screening mammogram for malignant neoplasm of breast: Secondary | ICD-10-CM | POA: Insufficient documentation

## 2024-04-07 DIAGNOSIS — Z9882 Breast implant status: Secondary | ICD-10-CM | POA: Insufficient documentation

## 2024-04-07 DIAGNOSIS — Z01419 Encounter for gynecological examination (general) (routine) without abnormal findings: Secondary | ICD-10-CM

## 2024-04-16 ENCOUNTER — Other Ambulatory Visit: Payer: Self-pay | Admitting: Family Medicine

## 2024-04-16 DIAGNOSIS — E038 Other specified hypothyroidism: Secondary | ICD-10-CM

## 2024-04-19 ENCOUNTER — Encounter: Payer: Self-pay | Admitting: Family Medicine

## 2024-04-19 ENCOUNTER — Other Ambulatory Visit: Payer: Self-pay | Admitting: Family Medicine

## 2024-04-19 ENCOUNTER — Ambulatory Visit (AMBULATORY_SURGERY_CENTER)

## 2024-04-19 VITALS — Ht 63.0 in | Wt 182.0 lb

## 2024-04-19 DIAGNOSIS — E038 Other specified hypothyroidism: Secondary | ICD-10-CM

## 2024-04-19 DIAGNOSIS — Z1211 Encounter for screening for malignant neoplasm of colon: Secondary | ICD-10-CM

## 2024-04-19 MED ORDER — NA SULFATE-K SULFATE-MG SULF 17.5-3.13-1.6 GM/177ML PO SOLN: 1.0000 | Freq: Once | ORAL | 0 refills | Status: AC

## 2024-04-19 NOTE — Progress Notes (Signed)
 No egg or soy allergy known to patient  No issues known to pt with past sedation with any surgeries or procedures Patient denies ever being told they had issues or difficulty with intubation  No FH of Malignant Hyperthermia Pt is not on diet pills Pt is not on  home 02  Pt is not on blood thinners  Pt has some issues with constipation but increases water No A fib or A flutter Have any cardiac testing pending--no Pt can ambulate independently Pt denies use of chewing tobacco Discussed diabetic I weight loss medication holds Discussed NSAID holds Checked BMI Pt instructed to use Singlecare.com or GoodRx for a price reduction on prep  Patient's chart reviewed by Norleen Schillings CNRA prior to previsit and patient appropriate for the [facility].  Pre visit completed and red dot placed by patient's name on their procedure day (on provider's schedule).

## 2024-04-22 ENCOUNTER — Ambulatory Visit: Payer: Self-pay | Admitting: Family Medicine

## 2024-04-22 ENCOUNTER — Other Ambulatory Visit: Payer: Self-pay | Admitting: Family Medicine

## 2024-04-22 DIAGNOSIS — E038 Other specified hypothyroidism: Secondary | ICD-10-CM

## 2024-04-22 LAB — TSH+T4F+T3FREE
Free T4: 1.31 ng/dL (ref 0.82–1.77)
T3, Free: 2.5 pg/mL (ref 2.0–4.4)
TSH: 2.42 u[IU]/mL (ref 0.450–4.500)

## 2024-04-22 MED ORDER — LEVOTHYROXINE SODIUM 50 MCG PO TABS: 50.0000 ug | ORAL_TABLET | Freq: Every day | ORAL | 0 refills | Status: DC

## 2024-04-23 ENCOUNTER — Encounter: Payer: Self-pay | Admitting: Internal Medicine

## 2024-05-17 ENCOUNTER — Encounter: Payer: Self-pay | Admitting: Internal Medicine

## 2024-05-17 ENCOUNTER — Ambulatory Visit (AMBULATORY_SURGERY_CENTER): Admitting: Internal Medicine

## 2024-05-17 VITALS — BP 96/62 | HR 63 | Temp 97.9°F | Resp 11 | Ht 63.0 in | Wt 182.0 lb

## 2024-05-17 DIAGNOSIS — D128 Benign neoplasm of rectum: Secondary | ICD-10-CM | POA: Diagnosis not present

## 2024-05-17 DIAGNOSIS — D123 Benign neoplasm of transverse colon: Secondary | ICD-10-CM | POA: Diagnosis not present

## 2024-05-17 DIAGNOSIS — K648 Other hemorrhoids: Secondary | ICD-10-CM | POA: Diagnosis not present

## 2024-05-17 DIAGNOSIS — Z1211 Encounter for screening for malignant neoplasm of colon: Secondary | ICD-10-CM

## 2024-05-17 DIAGNOSIS — D122 Benign neoplasm of ascending colon: Secondary | ICD-10-CM

## 2024-05-17 MED ORDER — SODIUM CHLORIDE 0.9 % IV SOLN: 500.0000 mL | Freq: Once | INTRAVENOUS | Status: DC

## 2024-05-17 MED ORDER — HYDROCORTISONE (PERIANAL) 2.5 % EX CREA: 1.0000 | TOPICAL_CREAM | Freq: Two times a day (BID) | CUTANEOUS | 1 refills | Status: AC

## 2024-05-17 NOTE — Progress Notes (Signed)
 Pt's states no medical or surgical changes since previsit or office visit.

## 2024-05-17 NOTE — Progress Notes (Signed)
 Report to PACU, RN, vss, BBS= Clear.

## 2024-05-17 NOTE — Patient Instructions (Signed)

## 2024-05-17 NOTE — Progress Notes (Signed)
 Called to room to assist during endoscopic procedure.  Patient ID and intended procedure confirmed with present staff. Received instructions for my participation in the procedure from the performing physician.

## 2024-05-17 NOTE — Op Note (Signed)
 [facility] Patient Name: Jocelyn Coleman Procedure Date: 05/17/2024 9:20 AM MRN: [id number] Endoscopist: Rosario Estefana Kidney , , [id number] Age: 53 Referring MD:  Date of Birth: Jul 09, 1971 Gender: Female Account #: [id number] Procedure:                Colonoscopy Indications:              Screening for colorectal malignant neoplasm, This                            is the patient's first colonoscopy Medicines:                Monitored Anesthesia Care Procedure:                Pre-Anesthesia Assessment:                           - Prior to the procedure, a History and Physical                            was performed, and patient medications and                            allergies were reviewed. The patient's tolerance of                            previous anesthesia was also reviewed. The risks                            and benefits of the procedure and the sedation                            options and risks were discussed with the patient.                            All questions were answered, and informed consent                            was obtained. Prior Anticoagulants: The patient has                            taken no anticoagulant or antiplatelet agents. ASA                            Grade Assessment: II - A patient with mild systemic                            disease. After reviewing the risks and benefits,                            the patient was deemed in satisfactory condition to                            undergo the procedure.  After obtaining informed consent, the colonoscope                            was passed under direct vision. Throughout the                            procedure, the patient's blood pressure, pulse, and                            oxygen saturations were monitored continuously. The                            Olympus Scope SN: [id number] was introduced through                            the anus and  advanced to the the terminal ileum.                            The colonoscopy was performed without difficulty.                            The patient tolerated the procedure well. The                            quality of the bowel preparation was excellent. The                            terminal ileum, ileocecal valve, appendiceal                            orifice, and rectum were photographed. Scope In: 9:27:37 AM Scope Out: 9:43:41 AM Scope Withdrawal Time: 0 hours 11 minutes 30 seconds  Total Procedure Duration: 0 hours 16 minutes 4 seconds  Findings:                 The terminal ileum appeared normal.                           Two sessile polyps were found in the transverse                            colon and ascending colon. The polyps were 3 to 6                            mm in size. These polyps were removed with a cold                            snare. Resection and retrieval were complete.                           An 8 mm polyp was found in the rectum. The polyp                            was  sessile. The polyp was removed with a cold                            snare. Resection and retrieval were complete.                           Non-bleeding internal hemorrhoids were found during                            retroflexion. Complications:            No immediate complications. Estimated Blood Loss:     Estimated blood loss was minimal. Impression:               - The examined portion of the ileum was normal.                           - Two 3 to 6 mm polyps in the transverse colon and                            in the ascending colon, removed with a cold snare.                            Resected and retrieved.                           - One 8 mm polyp in the rectum, removed with a cold                            snare. Resected and retrieved.                           - Non-bleeding internal hemorrhoids. Recommendation:           - Discharge patient to home (with  escort).                           - Await pathology results.                           - Anusol  HC cream BID for 7 days. If hemorrhoids                            are bothersome in the future, would recommend                            colorectal surgery referral.                           - The findings and recommendations were discussed                            with the patient. Dr Estefana Federico Rosario Estefana Federico,  05/17/2024 9:46:39 AM

## 2024-05-17 NOTE — Progress Notes (Signed)
 GASTROENTEROLOGY PROCEDURE H&P NOTE   Primary Care Physician: Booker Darice SAUNDERS, FNP    Reason for Procedure:   Colon cancer screening  Plan:    Colonoscopy  Patient is appropriate for endoscopic procedure(s) in the ambulatory ([facility]) setting.  The nature of the procedure, as well as the risks, benefits, and alternatives were carefully and thoroughly reviewed with the patient. Ample time for discussion and questions allowed. The patient understood, was satisfied, and agreed to proceed.     HPI: Jocelyn Coleman is a 53 y.o. female who presents for colonoscopy for colon cancer screening. Denies blood in stools, changes in bowel habits, or unintentional weight loss. Denies family history of colon cancer.  Past Medical History:  Diagnosis Date   Allergy 1977   Swelling to throat   Heart murmur     Past Surgical History:  Procedure Laterality Date   BREAST ENHANCEMENT SURGERY     COSMETIC SURGERY  2025   Implants   TOOTH EXTRACTION      Prior to Admission medications   Medication Sig Start Date End Date Taking? Authorizing Provider  levothyroxine  (SYNTHROID ) 50 MCG tablet Take 1 tablet (50 mcg total) by mouth daily before breakfast. 04/22/24  Yes Dolby, Darice SAUNDERS, FNP  ondansetron  (ZOFRAN -ODT) 8 MG disintegrating tablet Take 8 mg by mouth every 8 (eight) hours as needed. 11/15/23   [provider]    Current Outpatient Medications  Medication Sig Dispense Refill   levothyroxine  (SYNTHROID ) 50 MCG tablet Take 1 tablet (50 mcg total) by mouth daily before breakfast. 90 tablet 0   ondansetron  (ZOFRAN -ODT) 8 MG disintegrating tablet Take 8 mg by mouth every 8 (eight) hours as needed.     Current Facility-Administered Medications  Medication Dose Route Frequency Provider Last Rate Last Admin   0.9 %  sodium chloride  infusion  500 mL Intravenous Once Federico Rosario BROCKS, MD        Allergies as of 05/17/2024 - Review Complete 04/19/2024  Allergen Reaction Noted   Erythromycin  Swelling 03/30/2014    Family History  Problem Relation Age of Onset   Colon polyps Mother    Breast cancer Mother 64       ? had two primaries?   Hypertension Mother    Cancer Mother    Colon polyps Father    Hyperlipidemia Father    Hypertension Father    Colon cancer Neg Hx    Esophageal cancer Neg Hx    Rectal cancer Neg Hx    Stomach cancer Neg Hx     Social History   Socioeconomic History   Marital status: Married    Spouse name: Not on file   Number of children: Not on file   Years of education: Not on file   Highest education level: Bachelor's degree (e.g., BA, AB, BS)  Occupational History   Not on file  Tobacco Use   Smoking status: Never   Smokeless tobacco: Never  Vaping Use   Vaping status: Never Used  Substance and Sexual Activity   Alcohol use: Yes    Comment: rarely   Drug use: Never   Sexual activity: Yes    Birth control/protection: Coitus interruptus  Other Topics Concern   Not on file  Social History Narrative   Not on file   Social Drivers of Health   Financial Resource Strain: Patient Declined (03/24/2024)   Overall Financial Resource Strain (CARDIA)    Difficulty of Paying Living Expenses: Patient declined  Recent Concern: Financial  Resource Strain - Medium Risk (03/15/2024)   Overall Financial Resource Strain (CARDIA)    Difficulty of Paying Living Expenses: Somewhat hard  Food Insecurity: Patient Declined (03/24/2024)   Hunger Vital Sign    Worried About Running Out of Food in the Last Year: Patient declined    Ran Out of Food in the Last Year: Patient declined  Transportation Needs: No Transportation Needs (03/24/2024)   PRAPARE - Administrator, Civil Service (Medical): No    Lack of Transportation (Non-Medical): No  Physical Activity: Sufficiently Active (03/24/2024)   Exercise Vital Sign    Days of Exercise per Week: 5 days    Minutes of Exercise per Session: 60 min  Recent Concern: Physical Activity - Insufficiently  Active (03/15/2024)   Exercise Vital Sign    Days of Exercise per Week: 1 day    Minutes of Exercise per Session: 30 min  Stress: Patient Declined (03/24/2024)   [employer] of Occupational Health - Occupational Stress Questionnaire    Feeling of Stress: Patient declined  Social Connections: Unknown (03/24/2024)   Social Connection and Isolation Panel    Frequency of Communication with Friends and Family: Patient declined    Frequency of Social Gatherings with Friends and Family: Patient declined    Attends Religious Services: Patient declined    Database administrator or Organizations: Patient declined    Attends Banker Meetings: Not on file    Marital Status: Married  Intimate Partner Violence: Not At Risk (01/12/2024)   Received from [facility]   HITS    Over the last 12 months how often did your partner physically hurt you?: Never    Over the last 12 months how often did your partner insult you or talk down to you?: Never    Over the last 12 months how often did your partner threaten you with physical harm?: Never    Over the last 12 months how often did your partner scream or curse at you?: Never    Physical Exam: Vital signs in last 24 hours: BP 121/79   Pulse 80   Temp 97.9 F (36.6 C)   Ht 5' 3 (1.6 m)   Wt 182 lb (82.6 kg)   LMP 05/17/2024 (Exact Date)   SpO2 99%   BMI 32.24 kg/m  GEN: NAD EYE: Sclerae anicteric ENT: MMM CV: Non-tachycardic Pulm: No increased work of breathing GI: Soft, NT/ND NEURO:  Alert & Oriented   Estefana Kidney, MD [facility]  05/17/2024 9:14 AM

## 2024-05-18 ENCOUNTER — Telehealth: Payer: Self-pay

## 2024-05-18 NOTE — Telephone Encounter (Signed)
  Follow up Call-     05/17/2024    9:06 AM  Call back number  Post procedure Call Back phone  # [phone]  Permission to leave phone message Yes     Patient questions:  Do you have a fever, pain , or abdominal swelling? No. Pain Score  0 *  Have you tolerated food without any problems? Yes.    Have you been able to return to your normal activities? Yes.    Do you have any questions about your discharge instructions: Diet   No. Medications  No. Follow up visit  No.  Do you have questions or concerns about your Care? No.  Actions: * If pain score is 4 or above: No action needed, pain <4.

## 2024-05-19 ENCOUNTER — Ambulatory Visit: Payer: Self-pay | Admitting: Internal Medicine

## 2024-05-19 LAB — SURGICAL PATHOLOGY

## 2024-07-17 ENCOUNTER — Other Ambulatory Visit: Payer: Self-pay | Admitting: Family Medicine

## 2024-07-17 DIAGNOSIS — E038 Other specified hypothyroidism: Secondary | ICD-10-CM

## 2024-07-20 ENCOUNTER — Encounter: Payer: Self-pay | Admitting: Family Medicine

## 2024-07-20 ENCOUNTER — Other Ambulatory Visit: Payer: Self-pay

## 2024-07-20 DIAGNOSIS — E038 Other specified hypothyroidism: Secondary | ICD-10-CM

## 2024-07-20 MED ORDER — LEVOTHYROXINE SODIUM 50 MCG PO TABS: 50.0000 ug | ORAL_TABLET | Freq: Every day | ORAL | 0 refills | Status: DC

## 2024-07-26 ENCOUNTER — Other Ambulatory Visit: Payer: Self-pay | Admitting: Medical Genetics

## 2024-07-26 DIAGNOSIS — Z006 Encounter for examination for normal comparison and control in clinical research program: Secondary | ICD-10-CM

## 2024-10-15 ENCOUNTER — Other Ambulatory Visit: Payer: Self-pay | Admitting: Urgent Care

## 2024-10-15 DIAGNOSIS — E038 Other specified hypothyroidism: Secondary | ICD-10-CM
